# Patient Record
Sex: Male | Born: 1975 | Race: Black or African American | Hispanic: No | Smoking: Never smoker
Health system: Southern US, Community
[De-identification: ages and names within clinical notes are randomized; demographics above are authoritative.]

## PROBLEM LIST (undated history)

## (undated) DIAGNOSIS — K219 Gastro-esophageal reflux disease without esophagitis: Secondary | ICD-10-CM

---

## 2004-12-06 ENCOUNTER — Emergency Department (HOSPITAL_COMMUNITY): Admission: EM | Admit: 2004-12-06 | Discharge: 2004-12-06 | Payer: Self-pay | Admitting: Family Medicine

## 2005-04-21 ENCOUNTER — Encounter: Admission: RE | Admit: 2005-04-21 | Discharge: 2005-07-20 | Payer: Self-pay | Admitting: Nurse Practitioner

## 2005-10-04 ENCOUNTER — Encounter: Admission: RE | Admit: 2005-10-04 | Discharge: 2005-10-04 | Payer: Self-pay | Admitting: Occupational Medicine

## 2005-10-18 ENCOUNTER — Encounter: Admission: RE | Admit: 2005-10-18 | Discharge: 2006-01-16 | Payer: Self-pay | Admitting: *Deleted

## 2006-06-29 ENCOUNTER — Emergency Department (HOSPITAL_COMMUNITY): Admission: EM | Admit: 2006-06-29 | Discharge: 2006-06-29 | Payer: Self-pay | Admitting: Emergency Medicine
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
## 2010-09-06 ENCOUNTER — Emergency Department (HOSPITAL_COMMUNITY): Admission: EM | Admit: 2010-09-06 | Discharge: 2010-09-06 | Payer: Self-pay | Admitting: Emergency Medicine

## 2011-08-28 ENCOUNTER — Emergency Department (HOSPITAL_COMMUNITY)
Admission: EM | Admit: 2011-08-28 | Discharge: 2011-08-29 | Disposition: A | Discharge status: Home / Self Care | Payer: Self-pay | Attending: Emergency Medicine | Admitting: Emergency Medicine

## 2011-08-28 ENCOUNTER — Other Ambulatory Visit: Payer: Self-pay

## 2011-08-28 ENCOUNTER — Encounter: Payer: Self-pay | Admitting: Emergency Medicine

## 2011-08-28 ENCOUNTER — Emergency Department (HOSPITAL_COMMUNITY): Payer: Self-pay

## 2011-08-28 DIAGNOSIS — R1013 Epigastric pain: Secondary | ICD-10-CM | POA: Insufficient documentation

## 2011-08-28 DIAGNOSIS — R7309 Other abnormal glucose: Secondary | ICD-10-CM | POA: Insufficient documentation

## 2011-08-28 DIAGNOSIS — R739 Hyperglycemia, unspecified: Secondary | ICD-10-CM

## 2011-08-28 DIAGNOSIS — K2970 Gastritis, unspecified, without bleeding: Secondary | ICD-10-CM | POA: Insufficient documentation

## 2011-08-28 DIAGNOSIS — R079 Chest pain, unspecified: Secondary | ICD-10-CM | POA: Insufficient documentation

## 2011-08-28 LAB — DIFFERENTIAL
Basophils Absolute: 0 10*3/uL (ref 0.0–0.1)
Basophils Relative: 0 % (ref 0–1)
Monocytes Absolute: 0.3 10*3/uL (ref 0.1–1.0)
Neutro Abs: 1.9 10*3/uL (ref 1.7–7.7)
Neutrophils Relative %: 54 % (ref 43–77)

## 2011-08-28 LAB — CBC
HCT: 44.5 % (ref 39.0–52.0)
MCHC: 33.9 g/dL (ref 30.0–36.0)
Platelets: 100 10*3/uL — ABNORMAL LOW (ref 150–400)
RDW: 11.9 % (ref 11.5–15.5)

## 2011-08-28 LAB — POCT I-STAT TROPONIN I: Troponin i, poc: 0.02 ng/mL (ref 0.00–0.08)

## 2011-08-28 LAB — COMPREHENSIVE METABOLIC PANEL
AST: 18 U/L (ref 0–37)
Albumin: 3.8 g/dL (ref 3.5–5.2)
Chloride: 99 mEq/L (ref 96–112)
Creatinine, Ser: 0.95 mg/dL (ref 0.50–1.35)
Total Bilirubin: 1.5 mg/dL — ABNORMAL HIGH (ref 0.3–1.2)

## 2011-08-28 MED ORDER — PANTOPRAZOLE SODIUM 40 MG IV SOLR
40.0000 mg | Freq: Once | INTRAVENOUS | Status: AC
  Administered 2011-08-28: 40 mg via INTRAVENOUS
  Filled 2011-08-28: qty 40

## 2011-08-28 MED ORDER — ONDANSETRON HCL 4 MG/2ML IJ SOLN
4.0000 mg | Freq: Once | INTRAMUSCULAR | Status: AC
  Administered 2011-08-28: 4 mg via INTRAVENOUS
  Filled 2011-08-28: qty 2

## 2011-08-28 MED ORDER — SODIUM CHLORIDE 0.9 % IV BOLUS (SEPSIS)
1000.0000 mL | Freq: Once | INTRAVENOUS | Status: AC
  Administered 2011-08-28: 1000 mL via INTRAVENOUS

## 2011-08-28 MED ORDER — PANTOPRAZOLE SODIUM 20 MG PO TBEC: 40.0000 mg | DELAYED_RELEASE_TABLET | Freq: Every day | ORAL | Status: AC

## 2011-08-28 MED ORDER — GI COCKTAIL ~~LOC~~
30.0000 mL | Freq: Once | ORAL | Status: AC
  Administered 2011-08-28: 30 mL via ORAL
  Filled 2011-08-28: qty 30

## 2011-08-28 NOTE — ED Notes (Signed)
Per ems pt coming from [facility] urgent care.  Initially called a stemi on pt from [place].  Pt initially  Stated to ems pain was 4 en route ems  Gave 324 asa and one nitro sl.  Pt now pain free.  Pt reports pain is epigastric when it comes and stays right there

## 2011-08-28 NOTE — ED Provider Notes (Addendum)
History     CSN: [id number] Arrival date & time: 08/28/2011  4:37 PM   First MD Initiated Contact with Patient 08/28/11 1639      Chief Complaint  Patient presents with  . Chest Pain    (Consider location/radiation/quality/duration/timing/severity/associated sxs/prior treatment) HPI.... epigastric pain for 5 days. Constant. Described as gas.  Eating makes it worse. Nothing makes better.  no chronic medical conditions.  Described as minimal.  Seen in urgent care Center. Sent to ER for evaluation  No past medical history on file.  No past surgical history on file.  No family history on file.  History  Substance Use Topics  . Smoking status: Not on file  . Smokeless tobacco: Not on file  . Alcohol Use: Not on file      Review of Systems  All other systems reviewed and are negative.    Allergies  Review of patient's allergies indicates no known allergies.  Home Medications  No current outpatient prescriptions on file.  Pulse 79  Temp(Src) 97.7 F (36.5 C) (Oral)  SpO2 100%  Physical Exam  Nursing note and vitals reviewed. Constitutional: He is oriented to person, place, and time. He appears well-developed and well-nourished.  HENT:  Head: Normocephalic and atraumatic.  Eyes: Conjunctivae and EOM are normal. Pupils are equal, round, and reactive to light.  Neck: Normal range of motion. Neck supple.  Cardiovascular: Normal rate and regular rhythm.   Pulmonary/Chest: Effort normal and breath sounds normal.  Abdominal: Soft. Bowel sounds are normal.       Minimal tenderness in epigastric  Musculoskeletal: Normal range of motion.  Neurological: He is alert and oriented to person, place, and time.  Skin: Skin is warm and dry.  Psychiatric: He has a normal mood and affect.    ED Course  Procedures (including critical care time)   Labs Reviewed  CBC  DIFFERENTIAL  COMPREHENSIVE METABOLIC PANEL  LIPASE, BLOOD  I-STAT TROPONIN I   No results  found.   No diagnosis found.   Date: 08/28/2011  Rate:72  Rhythm: normal sinus rhythm  QRS Axis: normal  Intervals: normal  ST/T Wave abnormalities: normal  Conduction Disutrbances:none  Narrative Interpretation:   Old EKG Reviewed: none available   MDM  Patient has no risk factors for heart disease. Pain is in the epigastric area. Suspect gastritis, peptic ulcer disease, possibly gallbladder. Doubt pancreatitis. Patient drinks minimal amount of alcohol  Will get ultrasound to rule out gallbladder disease      Donnetta Hutching, MD 08/28/11 1654  Donnetta Hutching, MD 08/28/11 2236

## 2011-08-28 NOTE — ED Notes (Signed)
Dr Adriana Simas notified pt ready to be discharged.

## 2011-08-28 NOTE — ED Notes (Signed)
Pt states epigastric pain that feels "like a bubble"  Pt states it is worse when he eats.

## 2011-08-28 NOTE — ED Notes (Signed)
Patient transported to Ultrasound 

## 2011-08-28 NOTE — ED Notes (Signed)
Pt calls this rn into room and states "i am ready to go" explained to pt discharge procedure.

## 2011-08-28 NOTE — ED Notes (Signed)
Pt removed from cardiac monitor per Dr Adriana Simas. Spoke with [place]. States he is on his way back from [place] and has 2 ob US ahead of this pt. Dr Adriana Simas notified.

## 2014-05-28 ENCOUNTER — Emergency Department (HOSPITAL_BASED_OUTPATIENT_CLINIC_OR_DEPARTMENT_OTHER)
Admission: EM | Admit: 2014-05-28 | Discharge: 2014-05-29 | Disposition: A | Discharge status: Home / Self Care | Payer: Managed Care, Other (non HMO) | Attending: Emergency Medicine | Admitting: Emergency Medicine

## 2014-05-28 ENCOUNTER — Emergency Department (HOSPITAL_BASED_OUTPATIENT_CLINIC_OR_DEPARTMENT_OTHER): Payer: Managed Care, Other (non HMO)

## 2014-05-28 ENCOUNTER — Encounter (HOSPITAL_BASED_OUTPATIENT_CLINIC_OR_DEPARTMENT_OTHER): Payer: Self-pay | Admitting: Emergency Medicine

## 2014-05-28 DIAGNOSIS — Z791 Long term (current) use of non-steroidal anti-inflammatories (NSAID): Secondary | ICD-10-CM | POA: Insufficient documentation

## 2014-05-28 DIAGNOSIS — R079 Chest pain, unspecified: Secondary | ICD-10-CM | POA: Diagnosis not present

## 2014-05-28 DIAGNOSIS — R110 Nausea: Secondary | ICD-10-CM | POA: Insufficient documentation

## 2014-05-28 DIAGNOSIS — K219 Gastro-esophageal reflux disease without esophagitis: Secondary | ICD-10-CM | POA: Insufficient documentation

## 2014-05-28 DIAGNOSIS — R1013 Epigastric pain: Secondary | ICD-10-CM | POA: Insufficient documentation

## 2014-05-28 HISTORY — DX: Gastro-esophageal reflux disease without esophagitis: K21.9

## 2014-05-28 LAB — CBC
HEMATOCRIT: 43.4 % (ref 39.0–52.0)
HEMOGLOBIN: 14.5 g/dL (ref 13.0–17.0)
MCH: 30.5 pg (ref 26.0–34.0)
MCHC: 33.4 g/dL (ref 30.0–36.0)
MCV: 91.2 fL (ref 78.0–100.0)
Platelets: 98 10*3/uL — ABNORMAL LOW (ref 150–400)
RBC: 4.76 MIL/uL (ref 4.22–5.81)
RDW: 11.3 % — AB (ref 11.5–15.5)
WBC: 6.4 10*3/uL (ref 4.0–10.5)

## 2014-05-28 LAB — DIFFERENTIAL
BASOS PCT: 0 % (ref 0–1)
Basophils Absolute: 0 10*3/uL (ref 0.0–0.1)
EOS ABS: 0.2 10*3/uL (ref 0.0–0.7)
EOS PCT: 2 % (ref 0–5)
Lymphocytes Relative: 7 % — ABNORMAL LOW (ref 12–46)
Lymphs Abs: 0.4 10*3/uL — ABNORMAL LOW (ref 0.7–4.0)
MONOS PCT: 11 % (ref 3–12)
Monocytes Absolute: 0.7 10*3/uL (ref 0.1–1.0)
NEUTROS PCT: 80 % — AB (ref 43–77)
Neutro Abs: 5.1 10*3/uL (ref 1.7–7.7)

## 2014-05-28 LAB — COMPREHENSIVE METABOLIC PANEL
ALK PHOS: 110 U/L (ref 39–117)
ALT: 14 U/L (ref 0–53)
ANION GAP: 11 (ref 5–15)
AST: 21 U/L (ref 0–37)
Albumin: 3.7 g/dL (ref 3.5–5.2)
BILIRUBIN TOTAL: 1.2 mg/dL (ref 0.3–1.2)
BUN: 13 mg/dL (ref 6–23)
CO2: 28 meq/L (ref 19–32)
CREATININE: 1 mg/dL (ref 0.50–1.35)
Calcium: 9.7 mg/dL (ref 8.4–10.5)
Chloride: 98 mEq/L (ref 96–112)
GFR calc Af Amer: >90 mL/min (ref >90)
GFR calc non Af Amer: >90 mL/min (ref >90)
Glucose, Bld: 315 mg/dL — ABNORMAL HIGH (ref 70–99)
Potassium: 4 mEq/L (ref 3.7–5.3)
Sodium: 137 mEq/L (ref 137–147)
Total Protein: 7.5 g/dL (ref 6.0–8.3)

## 2014-05-28 LAB — TROPONIN I: Troponin I: <0.3 ng/mL (ref <0.30)

## 2014-05-28 LAB — CBG MONITORING, ED: GLUCOSE-CAPILLARY: 264 mg/dL — AB (ref 70–99)

## 2014-05-28 MED ORDER — ASPIRIN 325 MG PO TABS
325.0000 mg | ORAL_TABLET | Freq: Once | ORAL | Status: AC
  Administered 2014-05-28: 325 mg via ORAL
  Filled 2014-05-28: qty 1

## 2014-05-28 MED ORDER — NITROGLYCERIN 0.4 MG SL SUBL
0.4000 mg | SUBLINGUAL_TABLET | SUBLINGUAL | Status: DC | PRN
  Administered 2014-05-28: 0.4 mg via SUBLINGUAL
  Filled 2014-05-28: qty 1

## 2014-05-28 MED ORDER — GI COCKTAIL ~~LOC~~
ORAL | Status: AC
  Filled 2014-05-28: qty 30

## 2014-05-28 MED ORDER — PANTOPRAZOLE SODIUM 40 MG PO TBEC
40.0000 mg | DELAYED_RELEASE_TABLET | Freq: Once | ORAL | Status: AC
  Administered 2014-05-28: 40 mg via ORAL
  Filled 2014-05-28: qty 1

## 2014-05-28 MED ORDER — GI COCKTAIL ~~LOC~~
30.0000 mL | Freq: Once | ORAL | Status: AC
  Administered 2014-05-28: 30 mL via ORAL

## 2014-05-28 NOTE — ED Provider Notes (Signed)
CSN: [id number]     Arrival date & time 05/28/14  1935 History  This chart was scribed for Toy CookeyMegan Tahnee Cifuentes, MD by Modena JanskyAlbert Thayil, ED Scribe. This patient was seen in room MH06/MH06 and the patient's care was started at 8:17 PM.   Chief Complaint  Patient presents with  . Abdominal Pain    epigastric pain   Patient is a 38 y.o. male presenting with abdominal pain. The history is provided by the patient. No language interpreter was used.  Abdominal Pain Pain location:  Epigastric Pain quality: dull and stabbing   Pain severity:  Moderate Onset quality:  Sudden Duration:  3 days Timing:  Intermittent Progression:  Unchanged Chronicity:  New Relieved by:  None tried Worsened by:  Nothing tried Ineffective treatments:  None tried Associated symptoms: cough and nausea   Associated symptoms: no chest pain, no constipation, no diarrhea, no dysuria, no fatigue, no fever, no shortness of breath and no vomiting    HPI Comments: Frederick Mejia is a 38 y.o. male with a hx of acid reflux who presents to the Emergency Department complaining of moderate intermittent epigastric abdominal pain that started two days ago. He reports that the duration of the episodes is 2 minutes. He describes the pain as a sharp and dull sensation. He reports that the pain sometimes radiates to his upper extremities and sides of chest. He currently rates the pain as a 4/10 and a 10/10 at its worst. He states that the pain has a random onset with no modifying factors. He states that his pain feels similar to acid reflux. He reports associated nausea and cough. He denies any hx of a cardiac stress test. He also denies any leg swelling or SOB.   Past Medical History  Diagnosis Date  . Acid reflux    No past surgical history on file. No family history on file. History  Substance Use Topics  . Smoking status: Never Smoker   . Smokeless tobacco: Not on file  . Alcohol Use: Yes     Comment: social    Review of Systems   Constitutional: Negative for fever, activity change, appetite change and fatigue.  HENT: Negative for congestion, facial swelling, rhinorrhea and trouble swallowing.   Eyes: Negative for photophobia and pain.  Respiratory: Positive for cough. Negative for chest tightness and shortness of breath.   Cardiovascular: Negative for chest pain and leg swelling.  Gastrointestinal: Positive for nausea. Negative for vomiting, abdominal pain, diarrhea and constipation.  Endocrine: Negative for polydipsia and polyuria.  Genitourinary: Negative for dysuria, urgency, decreased urine volume and difficulty urinating.  Musculoskeletal: Negative for back pain and gait problem.  Skin: Negative for color change, rash and wound.  Allergic/Immunologic: Negative for immunocompromised state.  Neurological: Negative for dizziness, facial asymmetry, speech difficulty, weakness, numbness and headaches.  Psychiatric/Behavioral: Negative for confusion, decreased concentration and agitation.   Allergies  Review of patient's allergies indicates no known allergies.  Home Medications   Prior to Admission medications   Medication Sig Start Date End Date Taking? Authorizing Provider  Bismuth Subsalicylate (PEPTO-BISMOL PO) Take 2 tablets by mouth daily as needed. For upset stomach     Historical Provider, MD  ibuprofen (ADVIL,MOTRIN) 200 MG tablet Take 400 mg by mouth 2 (two) times daily as needed. For pain     Historical Provider, MD  pantoprazole (PROTONIX) 20 MG tablet Take 2 tablets (40 mg total) by mouth daily. 08/28/11 08/27/12  Donnetta HutchingBrian Cook, MD  pantoprazole (PROTONIX) 20 MG tablet Take 1  tablet (20 mg total) by mouth 2 (two) times daily. 05/29/14   Toy Cookey, MD   BP 148/98  Pulse 86  Temp(Src) 98.6 F (37 C) (Oral)  Resp 20  Ht 5\' 10"  (1.778 m)  Wt 183 lb (83.008 kg)  BMI 26.26 kg/m2  SpO2 100% Physical Exam  Nursing note and vitals reviewed. Constitutional: He is oriented to person, place, and time.  He appears well-developed and well-nourished. No distress.  HENT:  Head: Normocephalic and atraumatic.  Mouth/Throat: No oropharyngeal exudate.  Eyes: Pupils are equal, round, and reactive to light.  Neck: Normal range of motion. Neck supple.  Cardiovascular: Normal rate, regular rhythm and normal heart sounds.  Exam reveals no gallop and no friction rub.   No murmur heard. Pulmonary/Chest: Effort normal and breath sounds normal. No respiratory distress. He has no wheezes. He has no rales.  Abdominal: Soft. Bowel sounds are normal. He exhibits no distension and no mass. There is no tenderness. There is no rebound and no guarding.  Mild epigastric pain.   Musculoskeletal: Normal range of motion. He exhibits no edema and no tenderness.  Neurological: He is alert and oriented to person, place, and time.  Skin: Skin is warm and dry.  Psychiatric: He has a normal mood and affect.    ED Course  Procedures (including critical care time) DIAGNOSTIC STUDIES: Oxygen Saturation is 100% on RA, normal by my interpretation.    COORDINATION OF CARE: 8:21 PM- Pt advised of plan for treatment which includes medication, radiology, and labs and pt agrees.  Labs Review Labs Reviewed  CBC - Abnormal; Notable for the following:    RDW 11.3 (*)    Platelets 98 (*)    All other components within normal limits  COMPREHENSIVE METABOLIC PANEL - Abnormal; Notable for the following:    Glucose, Bld 315 (*)    All other components within normal limits  DIFFERENTIAL - Abnormal; Notable for the following:    Neutrophils Relative % 80 (*)    Lymphocytes Relative 7 (*)    Lymphs Abs 0.4 (*)    All other components within normal limits  CBG MONITORING, ED - Abnormal; Notable for the following:    Glucose-Capillary 264 (*)    All other components within normal limits  TROPONIN I  TROPONIN I  TROPONIN I  CBC WITH DIFFERENTIAL    Imaging Review Dg Chest 2 View  05/28/2014   CLINICAL DATA:  Chest pain,  epigastric pain  EXAM: CHEST  2 VIEW  COMPARISON:  None.  FINDINGS: The heart size and mediastinal contours are within normal limits. Both lungs are clear. The visualized skeletal structures are unremarkable.  IMPRESSION: No active cardiopulmonary disease.   Electronically Signed   By: Natasha Mead M.D.   On: 05/28/2014 20:47     EKG Interpretation   Date/Time:  Wednesday May 28 2014 19:46:19 EDT Ventricular Rate:  90 PR Interval:  130 QRS Duration: 76 QT Interval:  358 QTC Calculation: 437 R Axis:   0 Text Interpretation:  Normal sinus rhythm Possible Inferior infarct , age  undetermined Abnormal ECG Confirmed by Bebe Shaggy  MD, DONALD ([id number]) on  05/29/2014 12:54:16 AM      MDM   Final diagnoses:  Chest pain, unspecified chest pain type   Pt is a 38 y.o. male with Pmhx as above who presents with 2 days of intermittent CP, lasting mins at a time, worse tonight with a constant CP since 6 or 7pm, constant. He has had  mild assoc nausea. No vom, no SOB. EKG with STE in precordial leads w/ TWI in inferior and low lateral leads. Spoke w/ cardiology, Dr. Jacinto Halim who reviewed EKG and priors with me and believes he does not have acute changes today, though EKG is abnormal at baseline. Risk factors for ACS include sex and DM. Will plan on getting 3hr then 6hr delta trop. If negative, will d/c home w/ plan for outpt visit in Dr. Verl Dicker office tomorrow.   CP resolved after ASA, GI cocktail and PO Protonix. First trop and 3hr trop negative. Last trop due at 1am. If neg, will d/c home as above w/ Rx for Protonix.    I personally performed the services described in this documentation, which was scribed in my presence. The recorded information has been reviewed and is accurate.      Toy Cookey, MD 05/29/14 1113

## 2014-05-28 NOTE — ED Notes (Signed)
Pt c/o center upper chest/epigastric pain x1hr, hx of acid reflux and this feels the same. Pt denies SOB/diaphrosis/pain radiating/nausea; no distress noted, pt states pre diabetic [id number] ago, denies taking meds, CBG 264

## 2014-05-28 NOTE — ED Notes (Signed)
MD at bedside. 

## 2014-05-29 LAB — TROPONIN I: Troponin I: <0.3 ng/mL (ref <0.30)

## 2014-05-29 MED ORDER — PANTOPRAZOLE SODIUM 20 MG PO TBEC: 20.0000 mg | DELAYED_RELEASE_TABLET | Freq: Two times a day (BID) | ORAL | Status: AC

## 2014-05-29 NOTE — ED Provider Notes (Signed)
I received pt in signout to check troponin Repeat troponin negative He is feeling improved and has had no pain He denies any SOB or weakness He would like to go home We discussed return precautions Advised need for cardiology followup (dr Jacinto Halimganji had been consulted earlier prior to my evaluation) EKG/labs reviewed   Joya Gaskinsonald W Bevan Disney, MD 05/29/14 [phone]

## 2014-05-29 NOTE — Discharge Instructions (Signed)
Chest Pain Observation  It is often hard to give a specific diagnosis for the cause of chest pain. Among other possibilities your symptoms might be caused by inadequate oxygen delivery to your heart (angina). Angina that is not treated or evaluated can lead to a heart attack (myocardial infarction) or death.  Blood tests, electrocardiograms, and X-rays may have been done to help determine a possible cause of your chest pain. After evaluation and observation, your health care provider has determined that it is unlikely your pain was caused by an unstable condition that requires hospitalization. However, a full evaluation of your pain may need to be completed, with additional diagnostic testing as directed. It is very important to keep your follow-up appointments. Not keeping your follow-up appointments could result in permanent heart damage, disability, or death. If there is any problem keeping your follow-up appointments, you must call your health care provider.  HOME CARE INSTRUCTIONS   Due to the slight chance that your pain could be angina, it is important to follow your health care provider's treatment plan and also maintain a healthy lifestyle:  · Maintain or work toward achieving a healthy weight.  · Stay physically active and exercise regularly.  · Decrease your salt intake.  · Eat a balanced, healthy diet. Talk to a dietitian to learn about heart-healthy foods.  · Increase your fiber intake by including whole grains, vegetables, fruits, and nuts in your diet.  · Avoid situations that cause stress, anger, or depression.  · Take medicines as advised by your health care provider. Report any side effects to your health care provider. Do not stop medicines or adjust the dosages on your own.  · Quit smoking. Do not use nicotine patches or gum until you check with your health care provider.  · Keep your blood pressure, blood sugar, and cholesterol levels within normal limits.  · Limit alcohol intake to no more than  1 drink per day for women who are not pregnant and 2 drinks per day for men.  · Do not abuse drugs.  SEEK IMMEDIATE MEDICAL CARE IF:  You have severe chest pain or pressure which may include symptoms such as:  · You feel pain or pressure in your arms, neck, jaw, or back.  · You have severe back or abdominal pain, feel sick to your stomach (nauseous), or throw up (vomit).  · You are sweating profusely.  · You are having a fast or irregular heartbeat.  · You feel short of breath while at rest.  · You notice increasing shortness of breath during rest, sleep, or with activity.  · You have chest pain that does not get better after rest or after taking your usual medicine.  · You wake from sleep with chest pain.  · You are unable to sleep because you cannot breathe.  · You develop a frequent cough or you are coughing up blood.  · You feel dizzy, faint, or experience extreme fatigue.  · You develop severe weakness, dizziness, fainting, or chills.  Any of these symptoms may represent a serious problem that is an emergency. Do not wait to see if the symptoms will go away. Call your local emergency services (911 in the [place] Do not drive yourself to the hospital.  MAKE SURE YOU:  · Understand these instructions.  · Will watch your condition.  · Will get help right away if you are not doing well or get worse.  Document Released: 10/29/2010 Document Revised: 10/01/2013 Document Reviewed: 03/28/2013    [employer]® Patient Information ©2015 [employer]. This information is not intended to replace advice given to you by your health care provider. Make sure you discuss any questions you have with your health care provider.

## 2016-03-30 IMAGING — CR DG CHEST 2V
2 series · 2 of 2 positions shown · non-contrast
Comparison: None.

CLINICAL DATA: Chest pain, epigastric pain

EXAM:
CHEST  2 VIEW

[w chest pa]
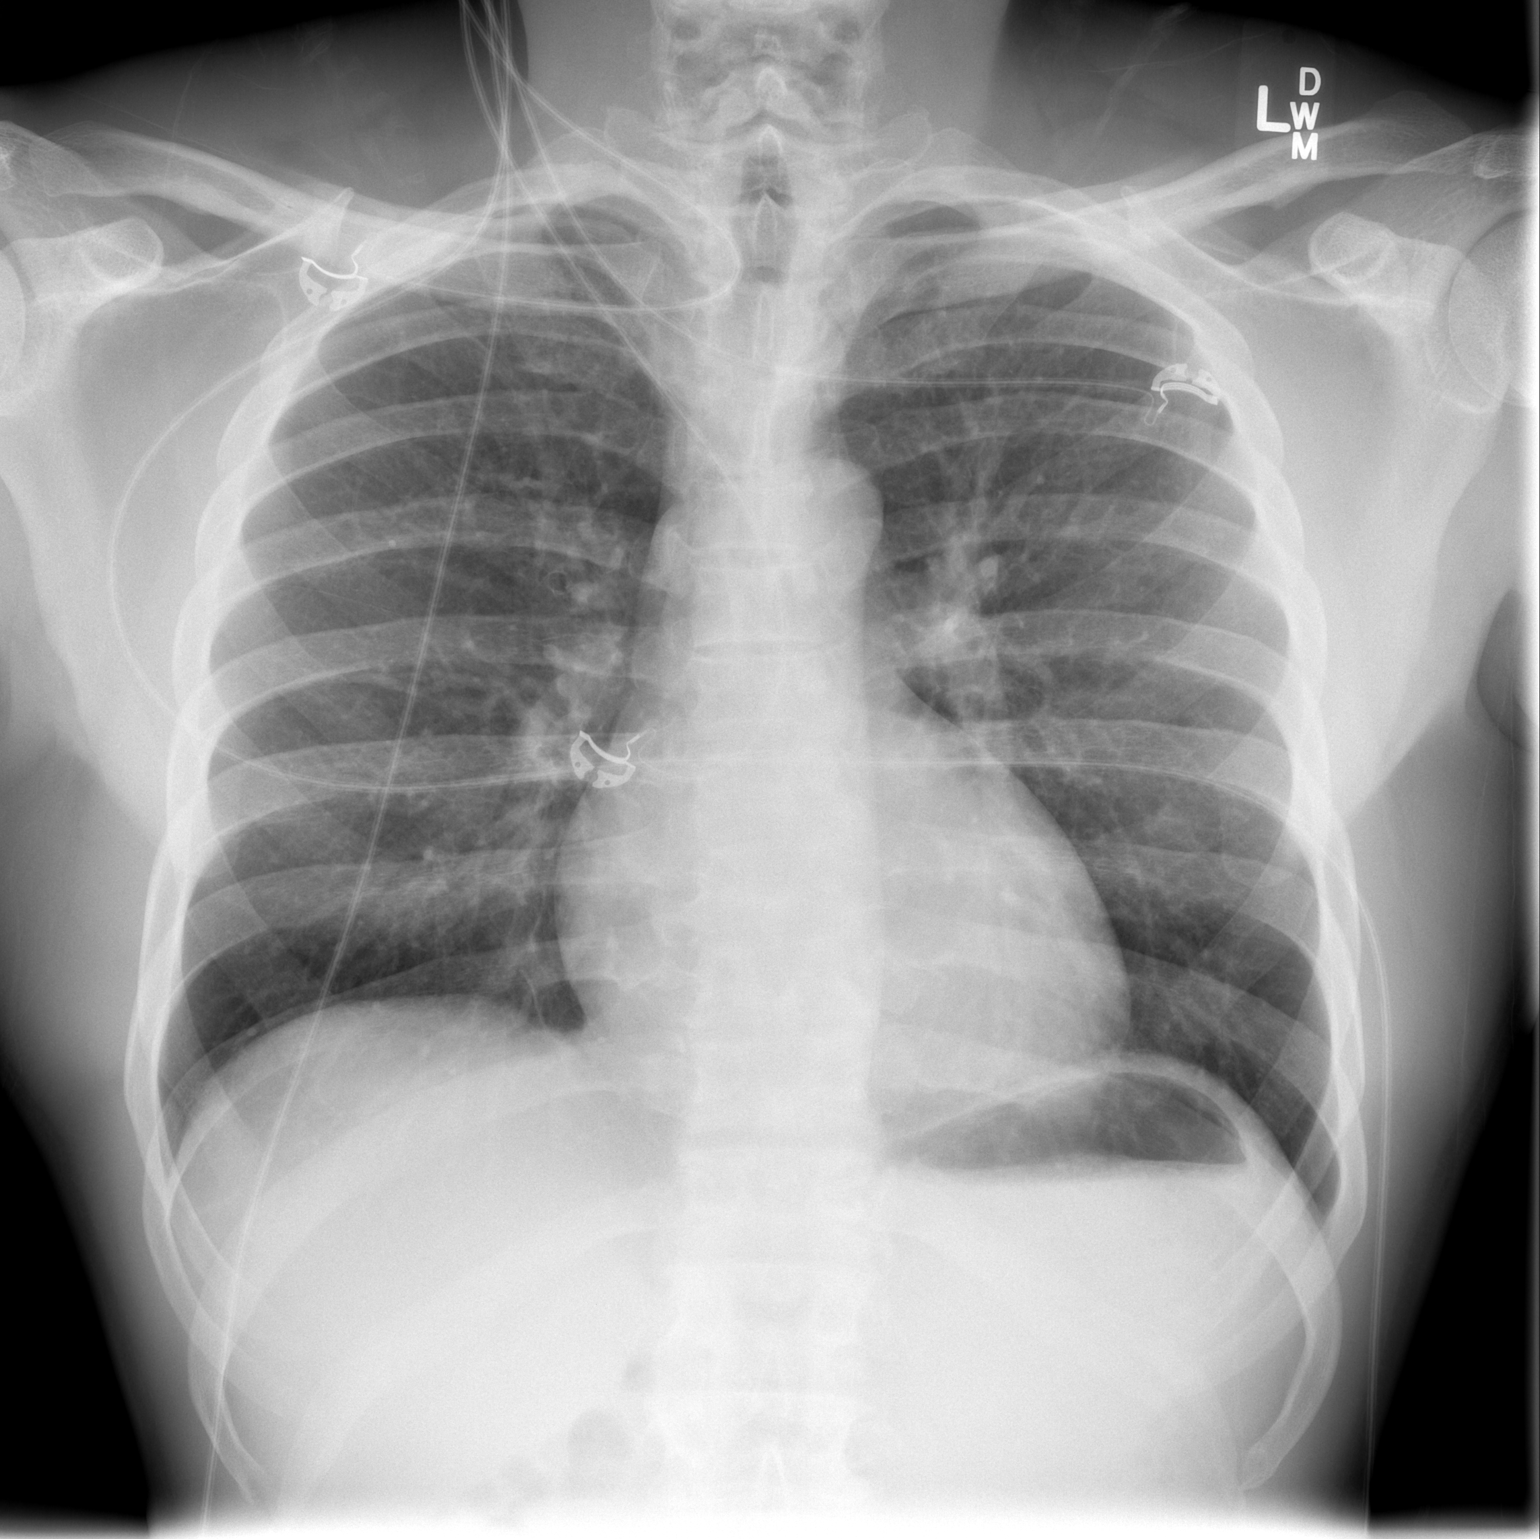

[w chest lat]
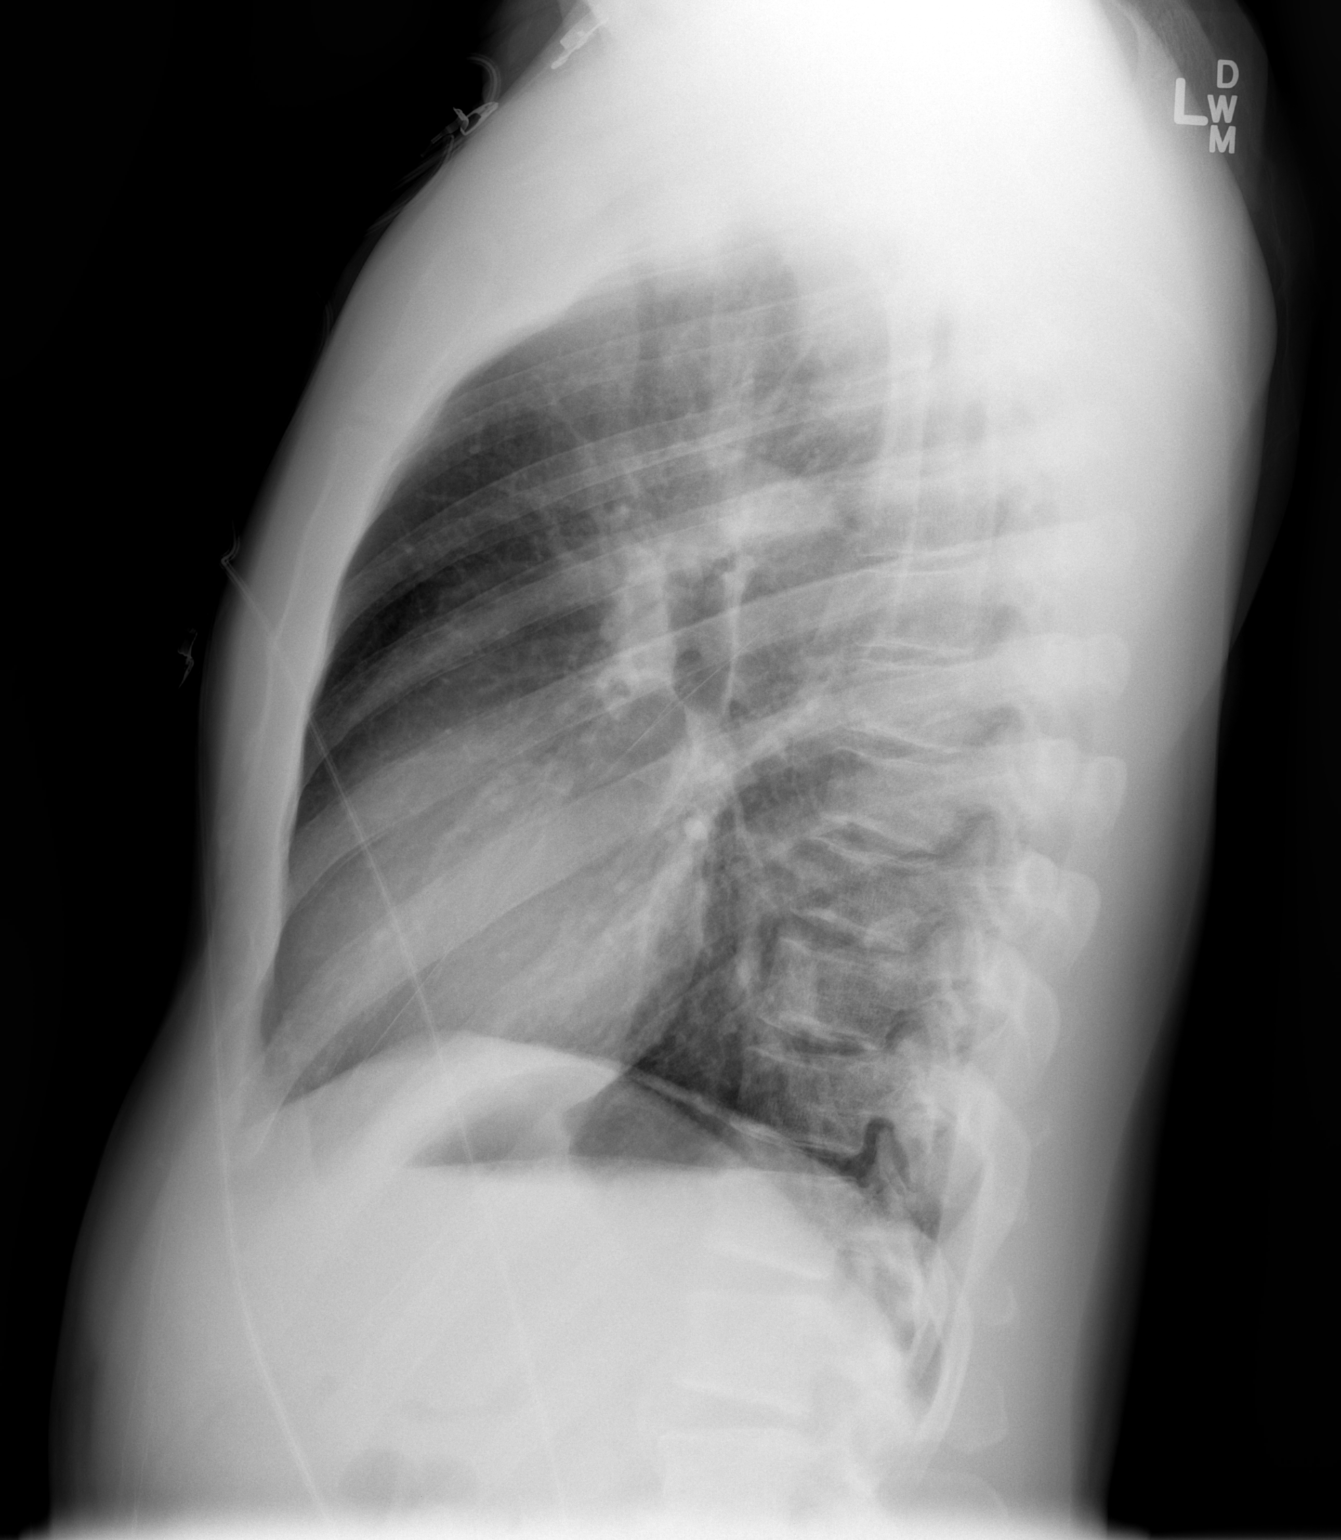

[2 of 2 positions shown; findings below may reference images not displayed]

FINDINGS: The heart size and mediastinal contours are within normal limits.
Both lungs are clear. The visualized skeletal structures are
unremarkable.
IMPRESSION: No active cardiopulmonary disease.
